# Patient Record
Sex: Male | Born: 1985 | Race: Black or African American | Hispanic: No | Marital: Single | Smoking: Former smoker
Health system: Southern US, Community
[De-identification: ages and names within clinical notes are randomized; demographics above are authoritative.]

## PROBLEM LIST (undated history)

## (undated) DIAGNOSIS — I82409 Acute embolism and thrombosis of unspecified deep veins of unspecified lower extremity: Secondary | ICD-10-CM

## (undated) HISTORY — PX: HAND SURGERY: SHX662

---

## 2013-06-25 ENCOUNTER — Encounter (HOSPITAL_COMMUNITY): Payer: Self-pay | Admitting: Emergency Medicine

## 2013-06-25 ENCOUNTER — Emergency Department (HOSPITAL_COMMUNITY)
Admission: EM | Admit: 2013-06-25 | Discharge: 2013-06-25 | Disposition: A | Discharge status: Home / Self Care | Payer: Self-pay | Attending: Emergency Medicine | Admitting: Emergency Medicine

## 2013-06-25 DIAGNOSIS — Z880 Allergy status to penicillin: Secondary | ICD-10-CM | POA: Insufficient documentation

## 2013-06-25 DIAGNOSIS — M7989 Other specified soft tissue disorders: Secondary | ICD-10-CM | POA: Insufficient documentation

## 2013-06-25 DIAGNOSIS — IMO0001 Reserved for inherently not codable concepts without codable children: Secondary | ICD-10-CM | POA: Insufficient documentation

## 2013-06-25 DIAGNOSIS — R799 Abnormal finding of blood chemistry, unspecified: Secondary | ICD-10-CM | POA: Insufficient documentation

## 2013-06-25 DIAGNOSIS — F17200 Nicotine dependence, unspecified, uncomplicated: Secondary | ICD-10-CM | POA: Insufficient documentation

## 2013-06-25 LAB — POCT I-STAT, CHEM 8
BUN: 14 mg/dL (ref 6–23)
Calcium, Ion: 1.26 mmol/L — ABNORMAL HIGH (ref 1.12–1.23)
Chloride: 100 mEq/L (ref 96–112)
Glucose, Bld: 80 mg/dL (ref 70–99)
HCT: 47 % (ref 39.0–52.0)
TCO2: 30 mmol/L (ref 0–100)

## 2013-06-25 LAB — PROTIME-INR: Prothrombin Time: 12.4 seconds (ref 11.6–15.2)

## 2013-06-25 MED ORDER — ENOXAPARIN SODIUM 100 MG/ML ~~LOC~~ SOLN
100.0000 mg | Freq: Once | SUBCUTANEOUS | Status: AC
  Administered 2013-06-25: 100 mg via SUBCUTANEOUS
  Filled 2013-06-25: qty 1

## 2013-06-25 NOTE — ED Provider Notes (Signed)
CSN: [id number]     Arrival date & time 06/25/13  1932 History   First MD Initiated Contact with Patient 06/25/13 2039     Chief Complaint  Patient presents with  . Leg Swelling   (Consider location/radiation/quality/duration/timing/severity/associated sxs/prior Treatment) Patient is a 27 y.o. male presenting with leg pain.  Leg Pain Location:  Leg Time since incident:  7 days Injury: no   Leg location:  L leg Pain details:    Quality:  Aching   Severity:  Severe (when ambulating, mild to none when resting)   Onset quality:  Sudden   Timing:  Intermittent   Progression:  Waxing and waning Chronicity:  New Prior injury to area:  No Relieved by:  Rest Associated symptoms: swelling     History reviewed. No pertinent past medical history. Past Surgical History  Procedure Laterality Date  . Hand surgery     Family History  Problem Relation Age of Onset  . Deep vein thrombosis Mother   . Cancer Other    History  Substance Use Topics  . Smoking status: Current Some Day Smoker  . Smokeless tobacco: Not on file  . Alcohol Use: Yes     Comment: occ    Review of Systems  Musculoskeletal: Positive for myalgias.  All other systems reviewed and are negative.    Allergies  Penicillins  Home Medications  No current outpatient prescriptions on file. BP 152/82  Pulse 54  Temp(Src) 97.6 F (36.4 C) (Oral)  Resp 14  Ht 6\' 2"  (1.88 m)  Wt 214 lb 4 oz (97.183 kg)  BMI 27.50 kg/m2  SpO2 100% Physical Exam  Nursing note and vitals reviewed. Constitutional: He is oriented to person, place, and time. He appears well-developed and well-nourished.  HENT:  Head: Normocephalic.  Eyes: Pupils are equal, round, and reactive to light.  Neck: Normal range of motion.  Cardiovascular: Normal rate and regular rhythm.   Pulmonary/Chest: Effort normal and breath sounds normal.  Abdominal: Soft.  Musculoskeletal: Normal range of motion. He exhibits edema and tenderness.       Left  lower leg: He exhibits tenderness and swelling.  Negative homan's.  Neurological: He is alert and oriented to person, place, and time.  Skin: Skin is warm and dry.  Psychiatric: He has a normal mood and affect. His behavior is normal. Judgment and thought content normal.    ED Course  Procedures (including critical care time) Labs Review Labs Reviewed  D-DIMER, QUANTITATIVE   Imaging Review No results found.  EKG Interpretation   None      Smoker.  No known injury.  No recent long-distance travel or prolonged sitting.  Labs reviewed, results shared with patient.  Elevated d-dimer. MDM  Calf pain with elevated d-dimer.  Will cover with dose of lovenox, patient to return to [facility] ED in the morning for lower extremity venous doppler.    Jimmye Norman, NP 06/26/13 0028

## 2013-06-25 NOTE — ED Notes (Signed)
Pt c/o LLE pain x 1 week. Pt does not recall what he was doing when pain began. Pt denies injury. Pt states 0/10 pain while lying, pain only when extremity dependent. Neg Homen's, no significant swelling noted, pain not reproducible. NP at bedside, plan of care explained to pt. Family at bedside. +CMS

## 2013-06-25 NOTE — ED Notes (Signed)
Pt states he has swelling in his left calf area  Pt states it started swelling on Wednesday  Pt denies injury  Pt states it is painful

## 2013-06-25 NOTE — ED Notes (Signed)
Pt given specific instructions to f/u with [facility] ED in AM for Doppler

## 2013-06-26 ENCOUNTER — Ambulatory Visit (HOSPITAL_COMMUNITY)
Admission: RE | Admit: 2013-06-26 | Discharge: 2013-06-26 | Disposition: A | Discharge status: Home / Self Care | Payer: Self-pay | Source: Ambulatory Visit | Attending: Emergency Medicine | Admitting: Emergency Medicine

## 2013-06-26 ENCOUNTER — Encounter (HOSPITAL_COMMUNITY): Payer: Self-pay | Admitting: Emergency Medicine

## 2013-06-26 ENCOUNTER — Emergency Department (HOSPITAL_COMMUNITY)
Admission: EM | Admit: 2013-06-26 | Discharge: 2013-06-26 | Disposition: A | Discharge status: Home / Self Care | Payer: Self-pay | Attending: Emergency Medicine | Admitting: Emergency Medicine

## 2013-06-26 DIAGNOSIS — M7989 Other specified soft tissue disorders: Secondary | ICD-10-CM

## 2013-06-26 DIAGNOSIS — M79609 Pain in unspecified limb: Secondary | ICD-10-CM

## 2013-06-26 DIAGNOSIS — F17200 Nicotine dependence, unspecified, uncomplicated: Secondary | ICD-10-CM | POA: Insufficient documentation

## 2013-06-26 DIAGNOSIS — I82402 Acute embolism and thrombosis of unspecified deep veins of left lower extremity: Secondary | ICD-10-CM

## 2013-06-26 DIAGNOSIS — I824Z9 Acute embolism and thrombosis of unspecified deep veins of unspecified distal lower extremity: Secondary | ICD-10-CM | POA: Insufficient documentation

## 2013-06-26 DIAGNOSIS — Z880 Allergy status to penicillin: Secondary | ICD-10-CM | POA: Insufficient documentation

## 2013-06-26 LAB — HOMOCYSTEINE: Homocysteine: 12.3 umol/L (ref 4.0–15.4)

## 2013-06-26 MED ORDER — RIVAROXABAN (XARELTO) EDUCATION KIT FOR DVT/PE PATIENTS
PACK | Freq: Once | Status: AC
  Administered 2013-06-26: 10:00:00
  Filled 2013-06-26: qty 1

## 2013-06-26 MED ORDER — RIVAROXABAN 15 MG PO TABS
15.0000 mg | ORAL_TABLET | Freq: Two times a day (BID) | ORAL | Status: DC
  Filled 2013-06-26 (×3): qty 1

## 2013-06-26 MED ORDER — RIVAROXABAN 15 MG PO TABS: 15.0000 mg | ORAL_TABLET | Freq: Two times a day (BID) | ORAL | Status: DC

## 2013-06-26 MED ORDER — RIVAROXABAN 15 MG PO TABS
15.0000 mg | ORAL_TABLET | Freq: Two times a day (BID) | ORAL | Status: DC
  Administered 2013-06-26: 15 mg via ORAL
  Filled 2013-06-26 (×2): qty 1

## 2013-06-26 MED ORDER — OXYCODONE-ACETAMINOPHEN 5-325 MG PO TABS: 1.0000 | ORAL_TABLET | ORAL | Status: DC | PRN

## 2013-06-26 NOTE — ED Provider Notes (Signed)
CSN: [id number]     Arrival date & time 06/26/13  0915 History   First MD Initiated Contact with Patient 06/26/13 [phone]     Chief Complaint  Patient presents with  . Positive DVT    (Consider location/radiation/quality/duration/timing/severity/associated sxs/prior Treatment) HPI Patient was seen in the ED last night and was sent for doppler US this morning to r/o DVT of the left leg.  The doppler was positive for DVT.  Patient denies CP, SOB, cough, fevers.   Denies any recent immobilization or personal history of blood clots.  States his mother has had blood clots in the past and is on Xarelto.  He does not know the cause of her blood clots.  Denies pain in the leg currently. Was given lovenox last night.  He does not have a PCP.   History reviewed. No pertinent past medical history. Past Surgical History  Procedure Laterality Date  . Hand surgery     Family History  Problem Relation Age of Onset  . Deep vein thrombosis Mother   . Cancer Other    History  Substance Use Topics  . Smoking status: Current Some Day Smoker  . Smokeless tobacco: Not on file  . Alcohol Use: Yes     Comment: occ    Review of Systems  Constitutional: Negative for fever.  Respiratory: Negative for cough and shortness of breath.   Cardiovascular: Positive for leg swelling. Negative for chest pain.  Gastrointestinal: Negative for nausea and vomiting.  Skin: Positive for color change.  Neurological: Negative for weakness and numbness.    Allergies  Penicillins  Home Medications  No current outpatient prescriptions on file. BP 124/75  Pulse 57  Temp(Src) 97.2 F (36.2 C) (Oral)  Resp 16  SpO2 100% Physical Exam  Nursing note and vitals reviewed. Constitutional: He appears well-developed and well-nourished. No distress.  HENT:  Head: Normocephalic and atraumatic.  Neck: Neck supple.  Pulmonary/Chest: Effort normal.  Musculoskeletal:       Left lower leg: He exhibits tenderness and swelling.   Left calf with some erythema, edema, tenderness.  Distal pulses intact, distal sensation intact.   Neurological: He is alert.  Skin: He is not diaphoretic.    ED Course  Procedures (including critical care time) Labs Review Labs Reviewed  ANTITHROMBIN III  PROTEIN C ACTIVITY  PROTEIN C, TOTAL  PROTEIN S ACTIVITY  PROTEIN S, TOTAL  LUPUS ANTICOAGULANT PANEL  BETA-2-GLYCOPROTEIN I ABS, IGG/M/A  HOMOCYSTEINE  FACTOR 5 LEIDEN  PROTHROMBIN GENE MUTATION  CARDIOLIPIN ANTIBODIES, IGG, IGM, IGA   Imaging Review No results found.  EKG Interpretation   None     \ 9:55 AM Pt currently pain free.  No CP or SOB.  No PCP.  Discussed pt with Dr Juleen China.  Have ordered hypercoaguability panel and will start on xarelto.  D/C home with PCP resources for follow up.    Filed Vitals:   06/26/13 1036  BP: 119/69  Pulse: 61  Temp:   Resp: 16     MDM   1. Left leg DVT     Pt p/w positive DVT study.  No known risk factors exact perhaps mother's hx of blood clots (unsure if there is a clotting disorder present).  Discussed treatment options with Dr Juleen China.  Pt given xarelto per pharmacy here and d/c home with same.  Referred to [facility].  Also will give other resources to ensure PCP follow up.  Pt is aware all clotting tests are pending and will  need to be followed up by PCP.  Pt verbalizes understanding that he will need to continue xarelto until indicated needs to stop by PCP (likely 3 months, depending on test results).  No CP or SOB concerning for PE.  Pt educated about PE/DVT and return precautions.  Discussed result, findings, treatment, and follow up  with patient.  Pt given return precautions.  Pt verbalizes understanding and agrees with plan.        Trixie Dredge, PA-C 06/26/13 1042

## 2013-06-26 NOTE — ED Notes (Signed)
Pt was seen at [facility] last for left leg pain for since at least Monday.  Had doppler done today and positive for blood clot

## 2013-06-26 NOTE — Progress Notes (Signed)
VASCULAR LAB PRELIMINARY  PRELIMINARY  PRELIMINARY  PRELIMINARY  Left lower extremity venous Dopplers completed.    Preliminary report:  There is acute DVT noted throughout the left posterior tibial vein.  All other veins appear thrombus free.  Kamilah Correia, RVT 06/26/2013, 9:09 AM

## 2013-06-27 NOTE — ED Provider Notes (Signed)
Medical screening examination/treatment/procedure(s) were performed by non-physician practitioner and as supervising physician I was immediately available for consultation/collaboration.  EKG Interpretation   None        Juliet Rude. Rubin Payor, MD 06/27/13 2355

## 2013-06-27 NOTE — ED Provider Notes (Signed)
Medical screening examination/treatment/procedure(s) were performed by non-physician practitioner and as supervising physician I was immediately available for consultation/collaboration.  EKG Interpretation   None        Juvenal Umar, MD 06/27/13 0732 

## 2013-06-28 LAB — LUPUS ANTICOAGULANT PANEL
DRVVT: 49.4 secs — ABNORMAL HIGH (ref <42.9)
PTT Lupus Anticoagulant: 42.5 secs (ref 28.0–43.0)

## 2013-06-28 LAB — PROTHROMBIN GENE MUTATION

## 2013-06-28 LAB — CARDIOLIPIN ANTIBODIES, IGG, IGM, IGA: Anticardiolipin IgG: 3 GPL U/mL — ABNORMAL LOW (ref <23)

## 2013-06-28 LAB — PROTEIN S ACTIVITY: Protein S Activity: 79 % (ref 69–129)

## 2013-06-28 LAB — BETA-2-GLYCOPROTEIN I ABS, IGG/M/A: Beta-2-Glycoprotein I IgA: 0 A Units (ref <20)

## 2013-06-28 LAB — PROTEIN S, TOTAL: Protein S Ag, Total: 88 % (ref 60–150)

## 2016-05-24 ENCOUNTER — Encounter (HOSPITAL_COMMUNITY): Payer: Self-pay | Admitting: *Deleted

## 2016-05-24 ENCOUNTER — Emergency Department (HOSPITAL_COMMUNITY)
Admission: EM | Admit: 2016-05-24 | Discharge: 2016-05-24 | Disposition: A | Discharge status: Home / Self Care | Payer: Self-pay | Attending: Emergency Medicine | Admitting: Emergency Medicine

## 2016-05-24 ENCOUNTER — Emergency Department (HOSPITAL_BASED_OUTPATIENT_CLINIC_OR_DEPARTMENT_OTHER): Admit: 2016-05-24 | Discharge: 2016-05-24 | Disposition: A | Discharge status: Home / Self Care | Payer: Self-pay

## 2016-05-24 DIAGNOSIS — M79609 Pain in unspecified limb: Secondary | ICD-10-CM

## 2016-05-24 DIAGNOSIS — F17210 Nicotine dependence, cigarettes, uncomplicated: Secondary | ICD-10-CM | POA: Insufficient documentation

## 2016-05-24 DIAGNOSIS — Z86718 Personal history of other venous thrombosis and embolism: Secondary | ICD-10-CM

## 2016-05-24 DIAGNOSIS — M79605 Pain in left leg: Secondary | ICD-10-CM

## 2016-05-24 HISTORY — DX: Acute embolism and thrombosis of unspecified deep veins of unspecified lower extremity: I82.409

## 2016-05-24 MED ORDER — NAPROXEN 500 MG PO TABS: 500.0000 mg | ORAL_TABLET | Freq: Two times a day (BID) | ORAL | 0 refills | Status: DC

## 2016-05-24 NOTE — ED Notes (Signed)
Patient transported to Ultrasound 

## 2016-05-24 NOTE — ED Notes (Signed)
Returned from U/S

## 2016-05-24 NOTE — ED Notes (Signed)
[facility] COUPON 173 GIVEN

## 2016-05-24 NOTE — ED Provider Notes (Signed)
[facility]-EMERGENCY DEPT Provider Note    By signing my name below, I, Earmon PhoenixJennifer Waddell, attest that this documentation has been prepared under the direction and in the presence of Vanice Rappa, PA-C. Electronically Signed: Earmon PhoenixJennifer Waddell, ED Scribe. 05/24/16. 11:41 AM.    History   Chief Complaint Chief Complaint  Patient presents with  . Leg Pain    The history is provided by the patient and medical records. No language interpreter was used.    HPI Comments:  Vincent Bray is a 30 y.o. male with PMHx of DVT of the left leg who presents to the Emergency Department complaining of posterior upper left leg pain that began about five months ago. He states the pain began in his left calf but has now moved into the posterior left thigh. He states he was on anticoagulation medication and discontinued it three years ago. Pt reports associated tingling of the left leg periodically that he believes is due to being on his feet for long periods of time. He has not taken anything for pain. Walking increases the pain. He denies alleviating factors. He denies CP, SOB, hemoptysis, redness, warmth or swelling of the LLE, back pain, numbness or weakness of the LLE, fever, chills, nausea, vomiting. He reports smoking. He states his mother has PMHx of blood clots.  Past Medical History:  Diagnosis Date  . DVT (deep venous thrombosis) (HCC)     There are no active problems to display for this patient.   Past Surgical History:  Procedure Laterality Date  . HAND SURGERY         Home Medications    Prior to Admission medications   Medication Sig Start Date End Date Taking? Authorizing Provider  oxyCODONE-acetaminophen (PERCOCET/ROXICET) 5-325 MG per tablet Take 1-2 tablets by mouth every 4 (four) hours as needed for severe pain. 06/26/13   Trixie DredgeEmily West, PA-C  Rivaroxaban (XARELTO) 15 MG TABS tablet Take 1 tablet (15 mg total) by mouth 2 (two) times daily. 06/26/13   Trixie DredgeEmily West, PA-C    Family  History Family History  Problem Relation Age of Onset  . Deep vein thrombosis Mother   . Cancer Other     Social History Social History  Substance Use Topics  . Smoking status: Current Some Day Smoker    Packs/day: 0.50    Types: Cigarettes  . Smokeless tobacco: Never Used  . Alcohol use Yes     Comment: occ     Allergies   Penicillins   Review of Systems Review of Systems  A complete 10 system review of systems was obtained and all systems are negative except as noted in the HPI and PMH.    Physical Exam Updated Vital Signs BP 110/71 (BP Location: Left Arm)   Pulse (!) 57   Temp 98.5 F (36.9 C) (Oral)   Resp 18   SpO2 100%   Physical Exam  Constitutional: He is oriented to person, place, and time. He appears well-developed and well-nourished.  HENT:  Head: Normocephalic and atraumatic.  Neck: Normal range of motion.  Cardiovascular: Normal rate.   Left DP pulse 2+  Pulmonary/Chest: Effort normal.  Musculoskeletal: Normal range of motion. He exhibits tenderness. He exhibits no edema or deformity.  No tenderness to palpation of left calf. Tenderness to palpation of left posterior upper leg in area of hamstring. No erythema, edema or warmth of left leg. Full ROM of the left leg.  No pain with ROM of left leg. No tenderness to palpation of thoracic  or lumbar spine.  Neurological: He is alert and oriented to person, place, and time.  Distal sensations of left foot intact. Strength 5/5 of BLE.  Skin: Skin is warm and dry. No erythema.  Psychiatric: He has a normal mood and affect. His behavior is normal.  Nursing note and vitals reviewed.    ED Treatments / Results  DIAGNOSTIC STUDIES: Oxygen Saturation is 100% on RA, normal by my interpretation.   COORDINATION OF CARE: 9:55 AM- Will order doppler study of LLE. Pt verbalizes understanding and agrees to plan.  Medications - No data to display    Labs (all labs ordered are listed, but only abnormal results  are displayed) Labs Reviewed - No data to display  EKG  EKG Interpretation None       Radiology No results found.  Procedures Procedures (including critical care time)  Medications Ordered in ED Medications - No data to display   Initial Impression / Assessment and Plan / ED Course  I have reviewed the triage vital signs and the nursing notes.  Pertinent labs & imaging results that were available during my care of the patient were reviewed by me and considered in my medical decision making (see chart for details).  Clinical Course   Patient with a history of LLE DVT presents today with a chief complaint of LLE pain.  No acute injury or trauma.  No signs of infection.  Doppler ultrasound negative for DVT.  Feel that symptoms are most consistent with muscle strain.  Stable for discharge.  Return precautions given.  I personally performed the services described in this documentation, which was scribed in my presence. The recorded information has been reviewed and is accurate.    Final Clinical Impressions(s) / ED Diagnoses   Final diagnoses:  None    New Prescriptions New Prescriptions   No medications on file     Santiago Glad, PA-C 05/24/16 1715    Donnetta Hutching, MD 05/26/16 [phone]

## 2016-05-24 NOTE — ED Triage Notes (Signed)
Pt states L leg pain for several months.  Hx of same several years ago and was dx and tx for a dvt.

## 2016-05-24 NOTE — Progress Notes (Signed)
*  PRELIMINARY RESULTS* Vascular Ultrasound Left lower extremity venous duplex has been completed.  Preliminary findings: No evidence of deep vein thrombosis or baker's cysts   Vincent Bray 05/24/2016, 11:08 AM

## 2016-05-24 NOTE — ED Notes (Signed)
C/o pain posterior left thigh x several months. Hx of LLE DVT. Was prescribed "blood thinner" but stopped taking because "I can't afford it'. States he smokes marijuana.

## 2018-06-05 ENCOUNTER — Emergency Department (HOSPITAL_COMMUNITY)
Admission: EM | Admit: 2018-06-05 | Discharge: 2018-06-05 | Disposition: A | Discharge status: Home / Self Care | Payer: Self-pay | Attending: Emergency Medicine | Admitting: Emergency Medicine

## 2018-06-05 ENCOUNTER — Encounter (HOSPITAL_COMMUNITY): Payer: Self-pay

## 2018-06-05 ENCOUNTER — Other Ambulatory Visit: Payer: Self-pay

## 2018-06-05 DIAGNOSIS — M79661 Pain in right lower leg: Secondary | ICD-10-CM | POA: Insufficient documentation

## 2018-06-05 DIAGNOSIS — Z86718 Personal history of other venous thrombosis and embolism: Secondary | ICD-10-CM | POA: Insufficient documentation

## 2018-06-05 DIAGNOSIS — Z79899 Other long term (current) drug therapy: Secondary | ICD-10-CM | POA: Insufficient documentation

## 2018-06-05 DIAGNOSIS — F17210 Nicotine dependence, cigarettes, uncomplicated: Secondary | ICD-10-CM | POA: Insufficient documentation

## 2018-06-05 NOTE — ED Notes (Signed)
E-signature not available, pt verbalized understanding of DC instructions and to return in AM for vascular study.

## 2018-06-05 NOTE — ED Triage Notes (Signed)
Pt from home for right leg pain, hx of DVT.

## 2018-06-05 NOTE — ED Provider Notes (Signed)
[facility] EMERGENCY DEPARTMENT Provider Note   CSN: [id number] Arrival date & time: 06/05/18  1857     History   Chief Complaint Chief Complaint  Patient presents with  . Leg Pain    HPI Vincent Bray is a 32 y.o. male presenting for evaluation of right leg pain.  Patient states pain began last night.  It is mild, and intermittent.  Pain is worse when he is standing on his feet for longer period of time.  Worse with use of the calf muscle.  He denies trauma or injury.  He denies recent travel, surgeries, immobilization, history of cancer.  Patient states he does have a history of a DVT 5 years ago, was on Xarelto for short period of time and then has not been on blood thinners since.  Family history of DVTs.  He denies chest pain or shortness of breath.  He denies fevers or chills.  He has not taken anything for pain including Tylenol or ibuprofen.  No pain on the left side.  Patient states he has no other medical problems, takes no medications daily.  He smokes cigarettes, denies alcohol or drug use.  Initial history obtained from chart review, patient with a positive DVT on ultrasound in 2014.  Has had multiple negative ultrasounds since.  HPI  Past Medical History:  Diagnosis Date  . DVT (deep venous thrombosis) (HCC)     There are no active problems to display for this patient.   Past Surgical History:  Procedure Laterality Date  . HAND SURGERY          Home Medications    Prior to Admission medications   Medication Sig Start Date End Date Taking? Authorizing Provider  naproxen (NAPROSYN) 500 MG tablet Take 1 tablet (500 mg total) by mouth 2 (two) times daily. 05/24/16   Santiago Glad, PA-C  oxyCODONE-acetaminophen (PERCOCET/ROXICET) 5-325 MG per tablet Take 1-2 tablets by mouth every 4 (four) hours as needed for severe pain. 06/26/13   Trixie Dredge, PA-C  Rivaroxaban (XARELTO) 15 MG TABS tablet Take 1 tablet (15 mg total) by mouth 2 (two) times  daily. 06/26/13   Trixie Dredge, PA-C    Family History Family History  Problem Relation Age of Onset  . Deep vein thrombosis Mother   . Cancer Other     Social History Social History   Tobacco Use  . Smoking status: Current Some Day Smoker    Packs/day: 0.50    Types: Cigarettes  . Smokeless tobacco: Never Used  Substance Use Topics  . Alcohol use: Yes    Comment: occ  . Drug use: Yes    Types: Marijuana     Allergies   Penicillins   Review of Systems Review of Systems  Musculoskeletal: Positive for myalgias.  All other systems reviewed and are negative.    Physical Exam Updated Vital Signs BP 127/82 (BP Location: Right Arm)   Pulse 92   Temp 97.9 F (36.6 C) (Oral)   Resp 16   SpO2 100%   Physical Exam  Constitutional: He is oriented to person, place, and time. He appears well-developed and well-nourished. No distress.  Sitting comfortably in the bed in no acute distress  HENT:  Head: Normocephalic and atraumatic.  Eyes: Pupils are equal, round, and reactive to light. Conjunctivae and EOM are normal.  Neck: Normal range of motion. Neck supple.  Cardiovascular: Normal rate, regular rhythm and intact distal pulses.  Pulmonary/Chest: Effort normal and breath sounds normal. No  respiratory distress. He has no wheezes.  Abdominal: Soft. He exhibits no distension. There is no tenderness.  Musculoskeletal: Normal range of motion. He exhibits no edema, tenderness or deformity.  No tenderness palpation of the left or right calf.  No obvious swelling or erythema.  Pedal pulses intact bilaterally.  Compartments soft.  Patient is amatory without difficulty.  Increased pain when standing and flexing the calf muscle.  No pain just with weightbearing.  Neurological: He is alert and oriented to person, place, and time. No sensory deficit.  Skin: Skin is warm and dry. Capillary refill takes less than 2 seconds.  Psychiatric: He has a normal mood and affect.  Nursing note  and vitals reviewed.   ED Treatments / Results  Labs (all labs ordered are listed, but only abnormal results are displayed) Labs Reviewed - No data to display  EKG None  Radiology No results found.  Procedures Procedures (including critical care time)  Medications Ordered in ED Medications - No data to display   Initial Impression / Assessment and Plan / ED Course  I have reviewed the triage vital signs and the nursing notes.  Pertinent labs & imaging results that were available during my care of the patient were reviewed by me and considered in my medical decision making (see chart for details).     Pt presenting for evaluation of right calf pain.  Physical exam reassuring, he is neurovascular intact.  Low suspicion for DVT, although patient has had a DVT in the past.  However, symptoms appear more musculoskeletal today.  Worse with flexion of the calf muscle.  No tenderness to palpation, no swelling, no erythema.  Doubt cellulitis.  As patient has a history of DVT, will order outpatient ultrasound for tomorrow.  I do not believe he needs Lovenox tonight.  Discussed treatment with Tylenol and ibuprofen.  At this time, patient proceed for discharge.  Return precautions given.  Patient states he understands and agrees to plan.   Final Clinical Impressions(s) / ED Diagnoses   Final diagnoses:  Right calf pain    ED Discharge Orders         Ordered    LE VENOUS     06/05/18 [address] PA-C 06/05/18 Margretta Ditty    Shaune Pollack, MD 06/05/18 2340

## 2018-06-05 NOTE — Discharge Instructions (Addendum)
Use Tylenol or ibuprofen as needed for pain. Follow-up for an outpatient ultrasound to rule out DVT. If you develop worsening pain, chest pain, difficulty breathing, return to the emergency room. Return to the emergency room with any new, worsening, concerning symptoms.

## 2018-06-06 ENCOUNTER — Ambulatory Visit (HOSPITAL_COMMUNITY): Admission: RE | Admit: 2018-06-06 | Payer: Self-pay | Source: Ambulatory Visit

## 2020-03-04 ENCOUNTER — Ambulatory Visit (HOSPITAL_COMMUNITY)
Admission: EM | Admit: 2020-03-04 | Discharge: 2020-03-05 | Disposition: A | Discharge status: Home / Self Care | Attending: Emergency Medicine | Admitting: Emergency Medicine

## 2020-03-04 ENCOUNTER — Other Ambulatory Visit: Payer: Self-pay

## 2020-03-04 DIAGNOSIS — Z86718 Personal history of other venous thrombosis and embolism: Secondary | ICD-10-CM | POA: Diagnosis not present

## 2020-03-04 DIAGNOSIS — Z7901 Long term (current) use of anticoagulants: Secondary | ICD-10-CM | POA: Diagnosis not present

## 2020-03-04 DIAGNOSIS — S02652A Fracture of angle of left mandible, initial encounter for closed fracture: Secondary | ICD-10-CM | POA: Insufficient documentation

## 2020-03-04 DIAGNOSIS — Z791 Long term (current) use of non-steroidal anti-inflammatories (NSAID): Secondary | ICD-10-CM | POA: Diagnosis not present

## 2020-03-04 DIAGNOSIS — S0083XA Contusion of other part of head, initial encounter: Secondary | ICD-10-CM | POA: Diagnosis not present

## 2020-03-04 DIAGNOSIS — S0269XA Fracture of mandible of other specified site, initial encounter for closed fracture: Secondary | ICD-10-CM

## 2020-03-04 DIAGNOSIS — S02609B Fracture of mandible, unspecified, initial encounter for open fracture: Secondary | ICD-10-CM

## 2020-03-04 DIAGNOSIS — F17210 Nicotine dependence, cigarettes, uncomplicated: Secondary | ICD-10-CM | POA: Insufficient documentation

## 2020-03-04 DIAGNOSIS — Z20822 Contact with and (suspected) exposure to covid-19: Secondary | ICD-10-CM | POA: Diagnosis not present

## 2020-03-04 DIAGNOSIS — S0269XB Fracture of mandible of other specified site, initial encounter for open fracture: Secondary | ICD-10-CM

## 2020-03-04 DIAGNOSIS — S01511A Laceration without foreign body of lip, initial encounter: Secondary | ICD-10-CM

## 2020-03-04 DIAGNOSIS — S02671B Fracture of alveolus of right mandible, initial encounter for open fracture: Secondary | ICD-10-CM | POA: Diagnosis present
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
## 2020-03-05 ENCOUNTER — Encounter (HOSPITAL_COMMUNITY): Payer: Self-pay | Admitting: Certified Registered Nurse Anesthetist

## 2020-03-05 ENCOUNTER — Emergency Department (HOSPITAL_COMMUNITY): Admitting: Anesthesiology

## 2020-03-05 ENCOUNTER — Emergency Department (HOSPITAL_COMMUNITY)

## 2020-03-05 ENCOUNTER — Encounter (HOSPITAL_COMMUNITY)
Admission: EM | Disposition: A | Discharge status: Home / Self Care | Payer: Self-pay | Source: Home / Self Care | Attending: Emergency Medicine

## 2020-03-05 DIAGNOSIS — Z7901 Long term (current) use of anticoagulants: Secondary | ICD-10-CM | POA: Diagnosis not present

## 2020-03-05 DIAGNOSIS — S01511A Laceration without foreign body of lip, initial encounter: Secondary | ICD-10-CM

## 2020-03-05 DIAGNOSIS — S02652A Fracture of angle of left mandible, initial encounter for closed fracture: Secondary | ICD-10-CM | POA: Diagnosis not present

## 2020-03-05 DIAGNOSIS — Z791 Long term (current) use of non-steroidal anti-inflammatories (NSAID): Secondary | ICD-10-CM | POA: Diagnosis not present

## 2020-03-05 DIAGNOSIS — Z20822 Contact with and (suspected) exposure to covid-19: Secondary | ICD-10-CM | POA: Diagnosis not present

## 2020-03-05 DIAGNOSIS — S02671B Fracture of alveolus of right mandible, initial encounter for open fracture: Secondary | ICD-10-CM | POA: Diagnosis present

## 2020-03-05 DIAGNOSIS — S02609B Fracture of mandible, unspecified, initial encounter for open fracture: Secondary | ICD-10-CM

## 2020-03-05 DIAGNOSIS — Z86718 Personal history of other venous thrombosis and embolism: Secondary | ICD-10-CM | POA: Diagnosis not present

## 2020-03-05 DIAGNOSIS — S0083XA Contusion of other part of head, initial encounter: Secondary | ICD-10-CM | POA: Diagnosis not present

## 2020-03-05 DIAGNOSIS — F17210 Nicotine dependence, cigarettes, uncomplicated: Secondary | ICD-10-CM | POA: Diagnosis not present

## 2020-03-05 HISTORY — PX: ORIF MANDIBULAR FRACTURE: SHX2127

## 2020-03-05 LAB — CBC WITH DIFFERENTIAL/PLATELET
Abs Immature Granulocytes: 0.05 10*3/uL (ref 0.00–0.07)
Basophils Absolute: 0 10*3/uL (ref 0.0–0.1)
Basophils Relative: 0 %
Eosinophils Absolute: 0 10*3/uL (ref 0.0–0.5)
Eosinophils Relative: 0 %
HCT: 45.1 % (ref 39.0–52.0)
Hemoglobin: 15 g/dL (ref 13.0–17.0)
Immature Granulocytes: 0 %
Lymphocytes Relative: 11 %
Lymphs Abs: 1.3 10*3/uL (ref 0.7–4.0)
MCH: 31.6 pg (ref 26.0–34.0)
MCHC: 33.3 g/dL (ref 30.0–36.0)
MCV: 95.1 fL (ref 80.0–100.0)
Monocytes Absolute: 0.9 10*3/uL (ref 0.1–1.0)
Monocytes Relative: 7 %
Neutro Abs: 9.6 10*3/uL — ABNORMAL HIGH (ref 1.7–7.7)
Neutrophils Relative %: 82 %
Platelets: 198 10*3/uL (ref 150–400)
RBC: 4.74 MIL/uL (ref 4.22–5.81)
RDW: 12.5 % (ref 11.5–15.5)
WBC: 11.8 10*3/uL — ABNORMAL HIGH (ref 4.0–10.5)
nRBC: 0 % (ref 0.0–0.2)

## 2020-03-05 LAB — COMPREHENSIVE METABOLIC PANEL
ALT: 19 U/L (ref 0–44)
AST: 26 U/L (ref 15–41)
Albumin: 5.4 g/dL — ABNORMAL HIGH (ref 3.5–5.0)
Alkaline Phosphatase: 48 U/L (ref 38–126)
Anion gap: 10 (ref 5–15)
BUN: 11 mg/dL (ref 6–20)
CO2: 25 mmol/L (ref 22–32)
Calcium: 9.7 mg/dL (ref 8.9–10.3)
Chloride: 102 mmol/L (ref 98–111)
Creatinine, Ser: 1.37 mg/dL — ABNORMAL HIGH (ref 0.61–1.24)
GFR calc Af Amer: >60 mL/min (ref >60)
GFR calc non Af Amer: >60 mL/min (ref >60)
Glucose, Bld: 90 mg/dL (ref 70–99)
Potassium: 3.8 mmol/L (ref 3.5–5.1)
Sodium: 137 mmol/L (ref 135–145)
Total Bilirubin: 0.5 mg/dL (ref 0.3–1.2)
Total Protein: 7.9 g/dL (ref 6.5–8.1)

## 2020-03-05 LAB — SARS CORONAVIRUS 2 BY RT PCR (HOSPITAL ORDER, PERFORMED IN ~~LOC~~ HOSPITAL LAB): SARS Coronavirus 2: NEGATIVE

## 2020-03-05 SURGERY — OPEN REDUCTION INTERNAL FIXATION (ORIF) MANDIBULAR FRACTURE
Anesthesia: General | Site: Mouth

## 2020-03-05 MED ORDER — ACETAMINOPHEN 160 MG/5ML PO SOLN: 1000.0000 mg | Freq: Once | ORAL | Status: DC | PRN

## 2020-03-05 MED ORDER — ACETAMINOPHEN 500 MG PO TABS: 1000.0000 mg | ORAL_TABLET | Freq: Once | ORAL | Status: DC | PRN

## 2020-03-05 MED ORDER — SODIUM CHLORIDE 0.9 % IV BOLUS
500.0000 mL | Freq: Once | INTRAVENOUS | Status: AC
  Administered 2020-03-05: 500 mL via INTRAVENOUS

## 2020-03-05 MED ORDER — ROCURONIUM BROMIDE 10 MG/ML (PF) SYRINGE
PREFILLED_SYRINGE | INTRAVENOUS | Status: DC | PRN
  Administered 2020-03-05: 80 mg via INTRAVENOUS

## 2020-03-05 MED ORDER — CEPHALEXIN 250 MG/5ML PO SUSR
500.0000 mg | Freq: Three times a day (TID) | ORAL | 0 refills | Status: DC
Start: 2020-03-05 — End: 2020-05-22

## 2020-03-05 MED ORDER — MIDAZOLAM HCL 2 MG/2ML IJ SOLN
INTRAMUSCULAR | Status: DC | PRN
  Administered 2020-03-05: 2 mg via INTRAVENOUS

## 2020-03-05 MED ORDER — ACETAMINOPHEN 10 MG/ML IV SOLN: 1000.0000 mg | Freq: Once | INTRAVENOUS | Status: DC | PRN

## 2020-03-05 MED ORDER — 0.9 % SODIUM CHLORIDE (POUR BTL) OPTIME
TOPICAL | Status: DC | PRN
  Administered 2020-03-05: 1000 mL

## 2020-03-05 MED ORDER — FENTANYL CITRATE (PF) 250 MCG/5ML IJ SOLN
INTRAMUSCULAR | Status: AC
  Filled 2020-03-05: qty 5

## 2020-03-05 MED ORDER — LACTATED RINGERS IV SOLN: INTRAVENOUS | Status: DC | PRN

## 2020-03-05 MED ORDER — BACITRACIN ZINC 500 UNIT/GM EX OINT
TOPICAL_OINTMENT | CUTANEOUS | Status: AC
  Filled 2020-03-05: qty 28.35

## 2020-03-05 MED ORDER — CLINDAMYCIN PHOSPHATE 600 MG/50ML IV SOLN
600.0000 mg | Freq: Once | INTRAVENOUS | Status: AC
  Administered 2020-03-05: 600 mg via INTRAVENOUS
  Filled 2020-03-05: qty 50

## 2020-03-05 MED ORDER — DEXAMETHASONE SODIUM PHOSPHATE 10 MG/ML IJ SOLN
INTRAMUSCULAR | Status: DC | PRN
  Administered 2020-03-05: 10 mg via INTRAVENOUS

## 2020-03-05 MED ORDER — PROPOFOL 10 MG/ML IV BOLUS
INTRAVENOUS | Status: DC | PRN
  Administered 2020-03-05: 130 mg via INTRAVENOUS

## 2020-03-05 MED ORDER — SUGAMMADEX SODIUM 200 MG/2ML IV SOLN
INTRAVENOUS | Status: DC | PRN
  Administered 2020-03-05: 400 mg via INTRAVENOUS

## 2020-03-05 MED ORDER — MORPHINE SULFATE (PF) 4 MG/ML IV SOLN: 4.0000 mg | INTRAVENOUS | Status: DC | PRN

## 2020-03-05 MED ORDER — FENTANYL CITRATE (PF) 100 MCG/2ML IJ SOLN: 25.0000 ug | INTRAMUSCULAR | Status: DC | PRN

## 2020-03-05 MED ORDER — OXYCODONE HCL 5 MG/5ML PO SOLN: 5.0000 mg | Freq: Once | ORAL | Status: DC | PRN

## 2020-03-05 MED ORDER — ACETAMINOPHEN 10 MG/ML IV SOLN
INTRAVENOUS | Status: DC | PRN
  Administered 2020-03-05: 1000 mg via INTRAVENOUS

## 2020-03-05 MED ORDER — ONDANSETRON HCL 4 MG/2ML IJ SOLN
INTRAMUSCULAR | Status: DC | PRN
  Administered 2020-03-05: 4 mg via INTRAVENOUS

## 2020-03-05 MED ORDER — FENTANYL CITRATE (PF) 250 MCG/5ML IJ SOLN
INTRAMUSCULAR | Status: DC | PRN
  Administered 2020-03-05: 100 ug via INTRAVENOUS
  Administered 2020-03-05 (×2): 50 ug via INTRAVENOUS

## 2020-03-05 MED ORDER — OXYCODONE HCL 5 MG PO TABS: 5.0000 mg | ORAL_TABLET | Freq: Once | ORAL | Status: DC | PRN

## 2020-03-05 MED ORDER — OXYMETAZOLINE HCL 0.05 % NA SOLN
NASAL | Status: AC
  Filled 2020-03-05: qty 30

## 2020-03-05 MED ORDER — LIDOCAINE 2% (20 MG/ML) 5 ML SYRINGE
INTRAMUSCULAR | Status: DC | PRN
  Administered 2020-03-05: 60 mg via INTRAVENOUS

## 2020-03-05 MED ORDER — BACITRACIN ZINC 500 UNIT/GM EX OINT
TOPICAL_OINTMENT | CUTANEOUS | Status: DC | PRN
  Administered 2020-03-05: 1 via TOPICAL

## 2020-03-05 MED ORDER — KETAMINE HCL 10 MG/ML IJ SOLN
INTRAMUSCULAR | Status: DC | PRN
Start: 2020-03-05 — End: 2020-03-05
  Administered 2020-03-05: 30 mg via INTRAVENOUS

## 2020-03-05 MED ORDER — PROPOFOL 10 MG/ML IV BOLUS
INTRAVENOUS | Status: AC
  Filled 2020-03-05: qty 20

## 2020-03-05 MED ORDER — CLINDAMYCIN PHOSPHATE 600 MG/50ML IV SOLN
INTRAVENOUS | Status: DC | PRN
Start: 2020-03-05 — End: 2020-03-05
  Administered 2020-03-05: 600 mg via INTRAVENOUS

## 2020-03-05 MED ORDER — DEXMEDETOMIDINE HCL 200 MCG/2ML IV SOLN
INTRAVENOUS | Status: DC | PRN
  Administered 2020-03-05 (×5): 4 ug via INTRAVENOUS

## 2020-03-05 MED ORDER — IBUPROFEN 200 MG PO CAPS: 600.0000 mg | ORAL_CAPSULE | Freq: Three times a day (TID) | ORAL | 1 refills | Status: AC

## 2020-03-05 MED ORDER — CLINDAMYCIN PHOSPHATE 600 MG/50ML IV SOLN
INTRAVENOUS | Status: AC
  Filled 2020-03-05: qty 50

## 2020-03-05 MED ORDER — MORPHINE SULFATE (PF) 4 MG/ML IV SOLN
4.0000 mg | Freq: Once | INTRAVENOUS | Status: AC
  Administered 2020-03-05: 4 mg via INTRAVENOUS
  Filled 2020-03-05: qty 1

## 2020-03-05 MED ORDER — MIDAZOLAM HCL 2 MG/2ML IJ SOLN
INTRAMUSCULAR | Status: AC
  Filled 2020-03-05: qty 2

## 2020-03-05 MED ORDER — PHENYLEPHRINE HCL (PRESSORS) 10 MG/ML IV SOLN
INTRAVENOUS | Status: DC | PRN
  Administered 2020-03-05 (×2): 40 ug via INTRAVENOUS
  Administered 2020-03-05: 80 ug via INTRAVENOUS

## 2020-03-05 MED ORDER — ACETAMINOPHEN 10 MG/ML IV SOLN
INTRAVENOUS | Status: AC
  Filled 2020-03-05: qty 100

## 2020-03-05 MED ORDER — ONDANSETRON HCL 4 MG/2ML IJ SOLN
4.0000 mg | Freq: Once | INTRAMUSCULAR | Status: AC | PRN
  Administered 2020-03-05: 4 mg via INTRAVENOUS
  Filled 2020-03-05: qty 2

## 2020-03-05 SURGICAL SUPPLY — 35 items
BAR FIX PREFORMED OMNIMAX (Miscellaneous) ×4 IMPLANT
BLADE SURG 15 STRL LF DISP TIS (BLADE) ×1 IMPLANT
BLADE SURG 15 STRL SS (BLADE) ×1
CANISTER SUCT 3000ML PPV (MISCELLANEOUS) ×2 IMPLANT
CLEANER TIP ELECTROSURG 2X2 (MISCELLANEOUS) ×2 IMPLANT
DRAPE HALF SHEET 40X57 (DRAPES) IMPLANT
ELECT COATED BLADE 2.86 ST (ELECTRODE) ×2 IMPLANT
ELECT REM PT RETURN 9FT ADLT (ELECTROSURGICAL) ×2
ELECTRODE REM PT RTRN 9FT ADLT (ELECTROSURGICAL) ×1 IMPLANT
GLOVE BIOGEL M 7.0 STRL (GLOVE) ×4 IMPLANT
GOWN STRL REUS W/ TWL LRG LVL3 (GOWN DISPOSABLE) ×2 IMPLANT
GOWN STRL REUS W/TWL LRG LVL3 (GOWN DISPOSABLE) ×2
KIT BASIN OR (CUSTOM PROCEDURE TRAY) ×2 IMPLANT
KIT TURNOVER KIT B (KITS) ×2 IMPLANT
NEEDLE HYPO 25GX1X1/2 BEV (NEEDLE) IMPLANT
NS IRRIG 1000ML POUR BTL (IV SOLUTION) ×2 IMPLANT
PAD ARMBOARD 7.5X6 YLW CONV (MISCELLANEOUS) ×4 IMPLANT
PENCIL SMOKE EVACUATOR (MISCELLANEOUS) IMPLANT
SCISSORS WIRE ANG 4 3/4 DISP (INSTRUMENTS) ×2 IMPLANT
SCREW BONE 2X7 CROSS DRIVE (Screw) ×8 IMPLANT
SCREW BONE MANDIB SD 2X9 (Screw) ×16 IMPLANT
SCREW SD IMF HEX 2.0X9 (Screw) ×2 IMPLANT
STAPLER VISISTAT 35W (STAPLE) ×2 IMPLANT
SUT CHROMIC 3 0 SH 27 (SUTURE) IMPLANT
SUT CHROMIC 4 0 P 3 18 (SUTURE) ×2 IMPLANT
SUT ETHILON 3 0 PS 1 (SUTURE) IMPLANT
SUT SILK 3 0 (SUTURE)
SUT SILK 3 0 SH 30 (SUTURE) IMPLANT
SUT SILK 3-0 18XBRD TIE 12 (SUTURE) IMPLANT
SUT STEEL 2 (SUTURE) ×2 IMPLANT
SUT VIC AB 3-0 FS2 27 (SUTURE) IMPLANT
TOWEL GREEN STERILE FF (TOWEL DISPOSABLE) ×2 IMPLANT
TRAY ENT MC OR (CUSTOM PROCEDURE TRAY) ×2 IMPLANT
WATER STERILE IRR 1000ML POUR (IV SOLUTION) ×2 IMPLANT
YANKAUER SUCT BULB TIP NO VENT (SUCTIONS) ×2 IMPLANT

## 2020-03-05 NOTE — ED Notes (Signed)
Consulting civil engineer at [employer] made aware of patient transport after shift change.
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
## 2020-03-05 NOTE — Transfer of Care (Signed)
Immediate Anesthesia Transfer of Care Note  Patient: Vincent Bray  Procedure(s) Performed: OPEN REDUCTION INTERNAL FIXATION (ORIF) MANDIBULAR FRACTURE (N/A Mouth)  Patient Location: PACU  Anesthesia Type:General  Level of Consciousness: sedated  Airway & Oxygen Therapy: Patient Spontanous Breathing and Patient connected to face mask oxygen  Post-op Assessment: Report given to RN and Post -op Vital signs reviewed and stable  Post vital signs: Reviewed and stable  Last Vitals:  Vitals Value Taken Time  BP 124/72 03/05/20 1055  Temp    Pulse 61 03/05/20 1102  Resp 9 03/05/20 1102  SpO2 100 % 03/05/20 1102  Vitals shown include unvalidated device data.  Last Pain:  Vitals:   03/05/20 1055  TempSrc:   PainSc: (P) Asleep      Patients Stated Pain Goal: 0 (03/05/20 5003)  Complications: No complications documented.

## 2020-03-05 NOTE — Anesthesia Preprocedure Evaluation (Signed)
Anesthesia Evaluation  Patient identified by MRN, date of birth, ID band Patient awake    Reviewed: Allergy & Precautions, NPO status , Patient's Chart, lab work & pertinent test results  History of Anesthesia Complications Negative for: history of anesthetic complications  Airway Mallampati: IV  TM Distance: >3 FB Neck ROM: Full  Mouth opening: Limited Mouth Opening  Dental  (+) Dental Advisory Given   Pulmonary neg recent URI, Current Smoker and Patient abstained from smoking.,    breath sounds clear to auscultation       Cardiovascular negative cardio ROS   Rhythm:Regular     Neuro/Psych negative neurological ROS  negative psych ROS   GI/Hepatic negative GI ROS, Neg liver ROS,   Endo/Other  negative endocrine ROS  Renal/GU negative Renal ROS     Musculoskeletal negative musculoskeletal ROS (+)   Abdominal   Peds  Hematology negative hematology ROS (+)   Anesthesia Other Findings   Reproductive/Obstetrics                             Anesthesia Physical Anesthesia Plan  ASA: II  Anesthesia Plan: General   Post-op Pain Management:    Induction: Intravenous  PONV Risk Score and Plan: 1 and Ondansetron and Dexamethasone  Airway Management Planned: Nasal ETT  Additional Equipment: None  Intra-op Plan:   Post-operative Plan: Extubation in OR  Informed Consent: I have reviewed the patients History and Physical, chart, labs and discussed the procedure including the risks, benefits and alternatives for the proposed anesthesia with the patient or authorized representative who has indicated his/her understanding and acceptance.     Dental advisory given  Plan Discussed with: CRNA and Surgeon  Anesthesia Plan Comments:         Anesthesia Quick Evaluation
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
## 2020-03-05 NOTE — ED Notes (Signed)
[facility] packet prepped and placed at nurses station.

## 2020-03-05 NOTE — Anesthesia Procedure Notes (Signed)
Procedure Name: Intubation Date/Time: 03/05/2020 9:28 AM Performed by: Dairl Ponder, CRNA Pre-anesthesia Checklist: Patient identified, Emergency Drugs available, Suction available, Patient being monitored and Timeout performed Patient Re-evaluated:Patient Re-evaluated prior to induction Oxygen Delivery Method: Circle system utilized Preoxygenation: Pre-oxygenation with 100% oxygen Induction Type: IV induction Ventilation: Mask ventilation without difficulty Laryngoscope Size: Glidescope and 4 Grade View: Grade I Nasal Tubes: Right and Nasal prep performed Tube size: 7.5 mm Number of attempts: 1 Airway Equipment and Method: Stylet Placement Confirmation: ETT inserted through vocal cords under direct vision,  positive ETCO2 and breath sounds checked- equal and bilateral Tube secured with: Tape Dental Injury: Teeth and Oropharynx as per pre-operative assessment

## 2020-03-05 NOTE — ED Notes (Addendum)
[facility] called to pick patient up at 8a for surgery at [facility] at 800.

## 2020-03-05 NOTE — ED Notes (Signed)
Report provided to [facility]. Pt transported to [facility] at this time.
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
## 2020-03-06 ENCOUNTER — Encounter (HOSPITAL_COMMUNITY): Payer: Self-pay | Admitting: Otolaryngology

## 2020-03-09 ENCOUNTER — Encounter (HOSPITAL_COMMUNITY): Payer: Self-pay | Admitting: Otolaryngology

## 2020-03-11 NOTE — Anesthesia Postprocedure Evaluation (Signed)
Anesthesia Post Note  Patient: Vincent Bray  Procedure(s) Performed: OPEN REDUCTION INTERNAL FIXATION (ORIF) MANDIBULAR FRACTURE (N/A Mouth)     Patient location during evaluation: PACU Anesthesia Type: General Level of consciousness: awake and alert Pain management: pain level controlled Vital Signs Assessment: post-procedure vital signs reviewed and stable Respiratory status: spontaneous breathing, nonlabored ventilation, respiratory function stable and patient connected to nasal cannula oxygen Cardiovascular status: blood pressure returned to baseline and stable Postop Assessment: no apparent nausea or vomiting Anesthetic complications: no   No complications documented.  Last Vitals:  Vitals:   03/05/20 1125 03/05/20 1130  BP: (!) 138/96 (!) 137/92  Pulse: 65 64  Resp: 11 13  Temp: 36.9 C   SpO2: 100% 100%    Last Pain:  Vitals:   03/05/20 1125  TempSrc:   PainSc: 0-No pain                 Jeanice Dempsey

## 2020-03-22 ENCOUNTER — Encounter (HOSPITAL_COMMUNITY): Payer: Self-pay | Admitting: Otolaryngology

## 2020-05-05 ENCOUNTER — Other Ambulatory Visit: Payer: Self-pay | Admitting: Otolaryngology

## 2020-05-05 ENCOUNTER — Other Ambulatory Visit (HOSPITAL_COMMUNITY): Payer: Self-pay | Admitting: Otolaryngology

## 2020-05-05 DIAGNOSIS — S02600D Fracture of unspecified part of body of mandible, subsequent encounter for fracture with routine healing: Secondary | ICD-10-CM

## 2020-05-10 ENCOUNTER — Other Ambulatory Visit: Payer: Self-pay | Admitting: Otolaryngology

## 2020-05-12 ENCOUNTER — Ambulatory Visit (HOSPITAL_COMMUNITY)
Admission: RE | Admit: 2020-05-12 | Discharge: 2020-05-12 | Disposition: A | Discharge status: Home / Self Care | Payer: Self-pay | Source: Ambulatory Visit | Attending: Otolaryngology | Admitting: Otolaryngology

## 2020-05-12 DIAGNOSIS — S02600D Fracture of unspecified part of body of mandible, subsequent encounter for fracture with routine healing: Secondary | ICD-10-CM | POA: Insufficient documentation

## 2020-05-22 ENCOUNTER — Encounter (HOSPITAL_BASED_OUTPATIENT_CLINIC_OR_DEPARTMENT_OTHER): Payer: Self-pay | Admitting: Otolaryngology

## 2020-05-22 ENCOUNTER — Other Ambulatory Visit: Payer: Self-pay

## 2020-05-22 ENCOUNTER — Other Ambulatory Visit (HOSPITAL_COMMUNITY): Payer: Self-pay

## 2020-05-25 ENCOUNTER — Encounter (HOSPITAL_BASED_OUTPATIENT_CLINIC_OR_DEPARTMENT_OTHER)
Admission: RE | Disposition: A | Discharge status: Home / Self Care | Payer: Self-pay | Source: Home / Self Care | Attending: Otolaryngology

## 2020-05-25 ENCOUNTER — Ambulatory Visit (HOSPITAL_BASED_OUTPATIENT_CLINIC_OR_DEPARTMENT_OTHER)
Admission: RE | Admit: 2020-05-25 | Discharge: 2020-05-25 | Disposition: A | Discharge status: Home / Self Care | Attending: Otolaryngology | Admitting: Otolaryngology

## 2020-05-25 ENCOUNTER — Ambulatory Visit (HOSPITAL_BASED_OUTPATIENT_CLINIC_OR_DEPARTMENT_OTHER): Admitting: Certified Registered"

## 2020-05-25 ENCOUNTER — Encounter (HOSPITAL_BASED_OUTPATIENT_CLINIC_OR_DEPARTMENT_OTHER): Payer: Self-pay | Admitting: Otolaryngology

## 2020-05-25 ENCOUNTER — Encounter (HOSPITAL_BASED_OUTPATIENT_CLINIC_OR_DEPARTMENT_OTHER)
Admission: RE | Admit: 2020-05-25 | Discharge: 2020-05-25 | Disposition: A | Discharge status: Home / Self Care | Source: Ambulatory Visit | Attending: Otolaryngology | Admitting: Otolaryngology

## 2020-05-25 ENCOUNTER — Other Ambulatory Visit: Payer: Self-pay

## 2020-05-25 DIAGNOSIS — Z880 Allergy status to penicillin: Secondary | ICD-10-CM | POA: Insufficient documentation

## 2020-05-25 DIAGNOSIS — Z472 Encounter for removal of internal fixation device: Secondary | ICD-10-CM | POA: Insufficient documentation

## 2020-05-25 DIAGNOSIS — Z20822 Contact with and (suspected) exposure to covid-19: Secondary | ICD-10-CM | POA: Diagnosis not present

## 2020-05-25 DIAGNOSIS — Z87891 Personal history of nicotine dependence: Secondary | ICD-10-CM | POA: Diagnosis not present

## 2020-05-25 HISTORY — PX: MANDIBULAR HARDWARE REMOVAL: SHX5205

## 2020-05-25 LAB — SARS CORONAVIRUS 2 BY RT PCR (HOSPITAL ORDER, PERFORMED IN ~~LOC~~ HOSPITAL LAB): SARS Coronavirus 2: NEGATIVE

## 2020-05-25 SURGERY — REMOVAL, HARDWARE, MANDIBLE
Anesthesia: Monitor Anesthesia Care | Site: Mouth

## 2020-05-25 MED ORDER — MIDAZOLAM HCL 2 MG/2ML IJ SOLN
INTRAMUSCULAR | Status: AC
  Filled 2020-05-25: qty 2

## 2020-05-25 MED ORDER — PROMETHAZINE HCL 25 MG/ML IJ SOLN: 6.2500 mg | INTRAMUSCULAR | Status: DC | PRN

## 2020-05-25 MED ORDER — FENTANYL CITRATE (PF) 100 MCG/2ML IJ SOLN
INTRAMUSCULAR | Status: DC | PRN
Start: 2020-05-25 — End: 2020-05-25
  Administered 2020-05-25 (×2): 50 ug via INTRAVENOUS

## 2020-05-25 MED ORDER — LACTATED RINGERS IV SOLN: INTRAVENOUS | Status: DC

## 2020-05-25 MED ORDER — OXYCODONE HCL 5 MG PO TABS
5.0000 mg | ORAL_TABLET | Freq: Once | ORAL | Status: AC | PRN
  Administered 2020-05-25: 5 mg via ORAL

## 2020-05-25 MED ORDER — FENTANYL CITRATE (PF) 100 MCG/2ML IJ SOLN
25.0000 ug | INTRAMUSCULAR | Status: DC | PRN
  Administered 2020-05-25: 25 ug via INTRAVENOUS

## 2020-05-25 MED ORDER — PROPOFOL 10 MG/ML IV BOLUS
INTRAVENOUS | Status: AC
  Filled 2020-05-25: qty 20

## 2020-05-25 MED ORDER — FENTANYL CITRATE (PF) 100 MCG/2ML IJ SOLN
INTRAMUSCULAR | Status: AC
  Filled 2020-05-25: qty 2

## 2020-05-25 MED ORDER — GLYCOPYRROLATE PF 0.2 MG/ML IJ SOSY
PREFILLED_SYRINGE | INTRAMUSCULAR | Status: AC
  Filled 2020-05-25: qty 1

## 2020-05-25 MED ORDER — DEXMEDETOMIDINE (PRECEDEX) IN NS 20 MCG/5ML (4 MCG/ML) IV SYRINGE
PREFILLED_SYRINGE | INTRAVENOUS | Status: DC | PRN
  Administered 2020-05-25 (×2): 8 ug via INTRAVENOUS
  Administered 2020-05-25: 4 ug via INTRAVENOUS

## 2020-05-25 MED ORDER — PROPOFOL 500 MG/50ML IV EMUL
INTRAVENOUS | Status: DC | PRN
  Administered 2020-05-25: 100 ug/kg/min via INTRAVENOUS

## 2020-05-25 MED ORDER — OXYCODONE HCL 5 MG/5ML PO SOLN: 5.0000 mg | Freq: Once | ORAL | Status: AC | PRN

## 2020-05-25 MED ORDER — OXYCODONE HCL 5 MG PO TABS
ORAL_TABLET | ORAL | Status: AC
  Filled 2020-05-25: qty 1

## 2020-05-25 MED ORDER — MIDAZOLAM HCL 5 MG/5ML IJ SOLN
INTRAMUSCULAR | Status: DC | PRN
  Administered 2020-05-25: 2 mg via INTRAVENOUS

## 2020-05-25 MED ORDER — LIDOCAINE 2% (20 MG/ML) 5 ML SYRINGE
INTRAMUSCULAR | Status: DC | PRN
  Administered 2020-05-25: 60 mg via INTRAVENOUS

## 2020-05-25 MED ORDER — DEXMEDETOMIDINE (PRECEDEX) IN NS 20 MCG/5ML (4 MCG/ML) IV SYRINGE
PREFILLED_SYRINGE | INTRAVENOUS | Status: AC
  Filled 2020-05-25: qty 5

## 2020-05-25 MED ORDER — EPHEDRINE 5 MG/ML INJ
INTRAVENOUS | Status: AC
  Filled 2020-05-25: qty 10

## 2020-05-25 MED ORDER — LIDOCAINE 2% (20 MG/ML) 5 ML SYRINGE
INTRAMUSCULAR | Status: AC
  Filled 2020-05-25: qty 5

## 2020-05-25 SURGICAL SUPPLY — 20 items
BLADE SURG 15 STRL LF DISP TIS (BLADE) ×1 IMPLANT
BLADE SURG 15 STRL SS (BLADE) ×1
CANISTER SUCT 1200ML W/VALVE (MISCELLANEOUS) ×2 IMPLANT
COVER MAYO STAND STRL (DRAPES) ×2 IMPLANT
ELECT REM PT RETURN 9FT ADLT (ELECTROSURGICAL) ×2
ELECTRODE REM PT RTRN 9FT ADLT (ELECTROSURGICAL) ×1 IMPLANT
GAUZE 4X4 16PLY RFD (DISPOSABLE) IMPLANT
GLOVE BIOGEL M 7.0 STRL (GLOVE) ×2 IMPLANT
GOWN STRL REUS W/ TWL LRG LVL3 (GOWN DISPOSABLE) ×2 IMPLANT
GOWN STRL REUS W/TWL LRG LVL3 (GOWN DISPOSABLE) ×2
NS IRRIG 1000ML POUR BTL (IV SOLUTION) ×2 IMPLANT
PACK BASIN DAY SURGERY FS (CUSTOM PROCEDURE TRAY) ×2 IMPLANT
PENCIL SMOKE EVACUATOR (MISCELLANEOUS) IMPLANT
SHEET MEDIUM DRAPE 40X70 STRL (DRAPES) ×2 IMPLANT
SUT CHROMIC 4 0 RB 1X27 (SUTURE) ×2 IMPLANT
SYR BULB EAR ULCER 3OZ GRN STR (SYRINGE) ×2 IMPLANT
TOWEL GREEN STERILE FF (TOWEL DISPOSABLE) ×2 IMPLANT
TRAY DSU PREP LF (CUSTOM PROCEDURE TRAY) IMPLANT
TUBE CONNECTING 20X1/4 (TUBING) ×2 IMPLANT
YANKAUER SUCT BULB TIP NO VENT (SUCTIONS) ×2 IMPLANT

## 2020-05-25 NOTE — Anesthesia Postprocedure Evaluation (Signed)
Anesthesia Post Note  Patient: Vincent Bray  Procedure(s) Performed: MANDIBULAR HARDWARE REMOVAL (N/A Mouth)     Patient location during evaluation: PACU Anesthesia Type: MAC Level of consciousness: awake and alert Pain management: pain level controlled Vital Signs Assessment: post-procedure vital signs reviewed and stable Respiratory status: spontaneous breathing, nonlabored ventilation and respiratory function stable Cardiovascular status: stable and blood pressure returned to baseline Anesthetic complications: no   No complications documented.  Last Vitals:  Vitals:   05/25/20 1200 05/25/20 1215  BP: 100/62 (!) 115/91  Pulse: (!) 45 (!) 45  Resp: 12 12  Temp:  36.7 C  SpO2: 97% 100%    Last Pain:  Vitals:   05/25/20 1215  TempSrc:   PainSc: [place]

## 2020-05-25 NOTE — Discharge Instructions (Signed)
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
## 2020-05-25 NOTE — Anesthesia Preprocedure Evaluation (Addendum)
Anesthesia Evaluation  Patient identified by MRN, date of birth, ID band Patient awake    Reviewed: Allergy & Precautions, NPO status , Patient's Chart, lab work & pertinent test results  History of Anesthesia Complications Negative for: history of anesthetic complications  Airway   TM Distance: >3 FB Neck ROM: Full   Comment:  Unable to assess mallampati as jaw is wired shut  Dental  (+) Dental Advisory Given   Pulmonary former smoker,    Pulmonary exam normal        Cardiovascular + DVT  Normal cardiovascular exam     Neuro/Psych negative neurological ROS  negative psych ROS   GI/Hepatic negative GI ROS, (+)     substance abuse  marijuana use,   Endo/Other  negative endocrine ROS  Renal/GU negative Renal ROS     Musculoskeletal negative musculoskeletal ROS (+)   Abdominal   Peds  Hematology negative hematology ROS (+)   Anesthesia Other Findings Covid test negative  Reproductive/Obstetrics                           Anesthesia Physical Anesthesia Plan  ASA: II  Anesthesia Plan: MAC   Post-op Pain Management:    Induction: Intravenous  PONV Risk Score and Plan: 1 and Treatment may vary due to age or medical condition and Propofol infusion  Airway Management Planned: Nasal Cannula and Natural Airway  Additional Equipment: None  Intra-op Plan:   Post-operative Plan:   Informed Consent: I have reviewed the patients History and Physical, chart, labs and discussed the procedure including the risks, benefits and alternatives for the proposed anesthesia with the patient or authorized representative who has indicated his/her understanding and acceptance.       Plan Discussed with: CRNA and Anesthesiologist  Anesthesia Plan Comments:        Anesthesia Quick Evaluation

## 2020-05-25 NOTE — Op Note (Signed)
Operative Note: MANDIBULO-MAXILLARY FIXATION  Patient: Vincent Bray  Medical record number: [id number]  Date:05/25/2020  Pre-operative Indications: Mandible fracture  Postoperative Indications: Same  Surgical Procedure: Mandibulo-Maxillary Fixation Removal  Anesthesia: GET  Surgeon: Delsa Bern, M.D.  Complications: None  EBL: Min   Brief History: The patient is a 34 y.o. male with a history of acute mandible fracture.  Pt underwent mandibulomaxillary fixation for management of mandible fracture on 03/04/2020 using Biomet arch bars and mandibulomaxillary fixation wiring.   After adequate time for appropriate healing the patient presents for removal of mandibulomaxillary fixation hardware.  Given the patient's history and findings, I recommended mandibulo-maxillary fixation hardware removal under sedated anesthesia, risks and benefits were discussed in detail with the patient and family. They understand and agree with our plan for surgery which is scheduled at [facility] day [facility].  Surgical Procedure: The patient is brought to the operating room on 05/25/2020 and placed in supine position on the operating table.  Sedated/MAC anesthesia was established without difficulty. When the patient was adequately anesthetized, surgical timeout was performed and correct identification of the patient and the surgical procedure. The patient was positioned and prepped and draped in sterile fashion.    Mandibular maxillary fixation wires were then cut and removed.  Soft tissue overlying the fixation screws was elevated.  Screws were removed along with from the mandibular and maxillary arch bars.  No significant bleeding.  Good occlusion.  The patient's incisions were closed with interrupted 4-0 chromic suture.  Patient was awakened from anesthetic, extubated and transferred from the operating room to the recovery room in stable condition. There were no complications and blood loss was  minimal.   Delsa Bern, M.D. [facility] 05/25/2020

## 2020-05-25 NOTE — Transfer of Care (Signed)
Immediate Anesthesia Transfer of Care Note  Patient: Vincent Bray  Procedure(s) Performed: MANDIBULAR HARDWARE REMOVAL (N/A Mouth)  Patient Location: PACU  Anesthesia Type:MAC  Level of Consciousness: drowsy  Airway & Oxygen Therapy: Patient Spontanous Breathing and Patient connected to face mask oxygen  Post-op Assessment: Report given to RN and Post -op Vital signs reviewed and stable  Post vital signs: Reviewed and stable  Last Vitals:  Vitals Value Taken Time  BP    Temp    Pulse 52 05/25/20 1127  Resp 7 05/25/20 1127  SpO2 100 % 05/25/20 1127  Vitals shown include unvalidated device data.  Last Pain:  Vitals:   05/25/20 0913  TempSrc: Oral  PainSc: 0-No pain         Complications: No complications documented.

## 2020-05-26 ENCOUNTER — Encounter (HOSPITAL_BASED_OUTPATIENT_CLINIC_OR_DEPARTMENT_OTHER): Payer: Self-pay | Admitting: Otolaryngology

## 2021-12-13 ENCOUNTER — Encounter (HOSPITAL_COMMUNITY): Payer: Self-pay | Admitting: Emergency Medicine

## 2021-12-13 ENCOUNTER — Emergency Department (HOSPITAL_COMMUNITY)
Admission: EM | Admit: 2021-12-13 | Discharge: 2021-12-13 | Disposition: A | Discharge status: Home / Self Care | Attending: Emergency Medicine | Admitting: Emergency Medicine

## 2021-12-13 DIAGNOSIS — I82401 Acute embolism and thrombosis of unspecified deep veins of right lower extremity: Secondary | ICD-10-CM | POA: Diagnosis not present

## 2021-12-13 DIAGNOSIS — I824Z1 Acute embolism and thrombosis of unspecified deep veins of right distal lower extremity: Secondary | ICD-10-CM

## 2021-12-13 DIAGNOSIS — R2241 Localized swelling, mass and lump, right lower limb: Secondary | ICD-10-CM | POA: Diagnosis present

## 2021-12-13 LAB — CBC
HCT: 42.8 % (ref 39.0–52.0)
Hemoglobin: 14.4 g/dL (ref 13.0–17.0)
MCH: 31.4 pg (ref 26.0–34.0)
MCHC: 33.6 g/dL (ref 30.0–36.0)
MCV: 93.4 fL (ref 80.0–100.0)
Platelets: 161 10*3/uL (ref 150–400)
RBC: 4.58 MIL/uL (ref 4.22–5.81)
RDW: 12.7 % (ref 11.5–15.5)
WBC: 6.1 10*3/uL (ref 4.0–10.5)
nRBC: 0 % (ref 0.0–0.2)

## 2021-12-13 LAB — BASIC METABOLIC PANEL
Anion gap: 7 (ref 5–15)
BUN: 8 mg/dL (ref 6–20)
CO2: 25 mmol/L (ref 22–32)
Calcium: 9.8 mg/dL (ref 8.9–10.3)
Chloride: 105 mmol/L (ref 98–111)
Creatinine, Ser: 1.28 mg/dL — ABNORMAL HIGH (ref 0.61–1.24)
GFR, Estimated: >60 mL/min (ref >60)
Glucose, Bld: 91 mg/dL (ref 70–99)
Potassium: 5.2 mmol/L — ABNORMAL HIGH (ref 3.5–5.1)
Sodium: 137 mmol/L (ref 135–145)

## 2021-12-13 MED ORDER — APIXABAN 5 MG PO TABS
10.0000 mg | ORAL_TABLET | Freq: Once | ORAL | Status: AC
  Administered 2021-12-13: 10 mg via ORAL
  Filled 2021-12-13: qty 2

## 2021-12-13 MED ORDER — APIXABAN (ELIQUIS) VTE STARTER PACK (10MG AND 5MG): ORAL_TABLET | ORAL | 0 refills | Status: DC

## 2021-12-13 MED ORDER — APIXABAN (ELIQUIS) VTE STARTER PACK (10MG AND 5MG): ORAL_TABLET | ORAL | 0 refills | Status: AC

## 2021-12-13 MED ORDER — ACETAMINOPHEN 500 MG PO TABS
1000.0000 mg | ORAL_TABLET | Freq: Once | ORAL | Status: AC
  Administered 2021-12-13: 1000 mg via ORAL
  Filled 2021-12-13: qty 2

## 2021-12-13 NOTE — ED Provider Notes (Signed)
[facility] EMERGENCY DEPARTMENT Provider Note   CSN: [id number] Arrival date & time: 12/13/21  1829     History  Chief Complaint  Patient presents with   DVT    Vincent Bray is a 36 y.o. male.  Pt with remote hx dvt, not currently on anticoag therapy, c/o right lower leg swelling in past few days. Symptoms acute onset, mild/moderate, persistent. Denies severe leg pain. No numbness/weakness. Denies any chest pain or discomfort. No sob or unusual doe. No recent abnormal bruising or bleeding. States tolerates prior anticoag therapy well without complication. No fever or chills. [facility] today and told positive for dvt and sent to ed for tx.   The history is provided by the patient and medical records (L.E.O.).      Home Medications Prior to Admission medications   Not on File      Allergies    Penicillins    Review of Systems   Review of Systems  Constitutional:  Negative for fever.  HENT:  Negative for nosebleeds.   Eyes:  Negative for redness.  Respiratory:  Negative for shortness of breath.   Cardiovascular:  Positive for leg swelling. Negative for chest pain.  Gastrointestinal:  Negative for blood in stool.  Skin:  Negative for rash.  Neurological:  Negative for headaches.  Hematological:  Does not bruise/bleed easily.   Physical Exam Updated Vital Signs BP (!) 129/95 (BP Location: Left Arm)   Pulse 74   Temp 98.5 F (36.9 C) (Oral)   Resp 16   SpO2 100%  Physical Exam Vitals and nursing note reviewed.  Constitutional:      Appearance: Normal appearance. He is well-developed.  HENT:     Head: Atraumatic.     Nose: Nose normal.     Mouth/Throat:     Mouth: Mucous membranes are moist.  Eyes:     General: No scleral icterus.    Conjunctiva/sclera: Conjunctivae normal.  Neck:     Trachea: No tracheal deviation.  Cardiovascular:     Rate and Rhythm: Normal rate and regular rhythm.     Pulses: Normal pulses.     Heart  sounds: Normal heart sounds. No murmur heard.   No friction rub. No gallop.  Pulmonary:     Effort: Pulmonary effort is normal. No accessory muscle usage or respiratory distress.     Breath sounds: Normal breath sounds.  Genitourinary:    Comments: No cva tenderness. Musculoskeletal:     Cervical back: Neck supple.     Comments: Mild right lower leg swelling. Distal pulse palp. No skin changes/lesions/cellulitis.   Skin:    General: Skin is warm and dry.     Findings: No rash.  Neurological:     Mental Status: He is alert.     Comments: Alert, speech clear. RLE nvi.   Psychiatric:        Mood and Affect: Mood normal.    ED Results / Procedures / Treatments   Labs (all labs ordered are listed, but only abnormal results are displayed) Results for orders placed or performed during the hospital encounter of 12/13/21  CBC  Result Value Ref Range   WBC 6.1 4.0 - 10.5 K/uL   RBC 4.58 4.22 - 5.81 MIL/uL   Hemoglobin 14.4 13.0 - 17.0 g/dL   HCT 42.8 39.0 - 52.0 %   MCV 93.4 80.0 - 100.0 fL   MCH 31.4 26.0 - 34.0 pg   MCHC 33.6 30.0 -  36.0 g/dL   RDW 12.7 11.5 - 15.5 %   Platelets 161 150 - 400 K/uL   nRBC 0.0 0.0 - 0.2 %  Basic metabolic panel  Result Value Ref Range   Sodium 137 135 - 145 mmol/L   Potassium 5.2 (H) 3.5 - 5.1 mmol/L   Chloride 105 98 - 111 mmol/L   CO2 25 22 - 32 mmol/L   Glucose, Bld 91 70 - 99 mg/dL   BUN 8 6 - 20 mg/dL   Creatinine, Ser 1.28 (H) 0.61 - 1.24 mg/dL   Calcium 9.8 8.9 - 10.3 mg/dL   GFR, Estimated >60 >60 mL/min   Anion gap 7 5 - 15    EKG None  Radiology No results found.  Procedures Procedures    Medications Ordered in ED Medications  apixaban (ELIQUIS) tablet 10 mg (has no administration in time range)    ED Course/ Medical Decision Making/ A&P                           Medical Decision Making Problems Addressed: Lower leg DVT (deep venous thromboembolism), acute, right ([place]): acute illness or injury with systemic  symptoms that poses a threat to life or bodily functions  Amount and/or Complexity of Data Reviewed Independent Historian:     Details: LEO, hx External Data Reviewed: notes. Labs: ordered. Decision-making details documented in ED Course.  Risk OTC drugs. Prescription drug management.   Labs sent.    Reviewed nursing notes and prior charts for additional history.  Records from jail reviewed. Pt notes u/s/dvt study was positive.   Eliquis po.   Labs reviewed/interpreted by me - wbc, hgb and plt normal.   Pt appears stable for d/c.   Rx provided.           Final Clinical Impression(s) / ED Diagnoses Final diagnoses:  None    Rx / DC Orders ED Discharge Orders     None         Lajean Saver, MD 12/13/21 2200

## 2021-12-13 NOTE — Discharge Instructions (Addendum)
It was our pleasure to provide your ER care today - we hope that you feel better.  Take eliquis (blood thinner). Elevate leg.  Drink plenty of fluids/stay well hydrated.   Follow up with primary care doctor in the next couple weeks, including for refill/continuation of blood thinner treatment.  Return to ER if worse, new symptoms, chest pain, trouble breathing, severe leg pain, abnormal bleeding, or other concern.

## 2021-12-13 NOTE — ED Provider Triage Note (Signed)
Emergency Medicine Provider Triage Evaluation Note  Vincent Bray , a 36 y.o. male  was evaluated in triage.  Pt complains of DVT.  Had ultrasound done today in jail which showed a DVT in his right lower extremity.  History of DVT about 8 years ago without a known cause that he is aware of.  No longer on anticoagulation.  Denies any chest pain, shortness of breath.  Review of Systems  Positive: Right leg pain Negative: Chest pain, shortness of breath  Physical Exam  BP (!) 129/95 (BP Location: Left Arm)   Pulse 74   Temp 98.5 F (36.9 C) (Oral)   Resp 16   SpO2 100%  Gen:   Awake, no distress   Resp:  Normal effort  MSK:   Moves extremities without difficulty  Other:  Ambulating without difficulty  Medical Decision Making  Medically screening exam initiated at 6:34 PM.  Appropriate orders placed.  Vincent Bray was informed that the remainder of the evaluation will be completed by another provider, this initial triage assessment does not replace that evaluation, and the importance of remaining in the ED until their evaluation is complete.  Will check baseline labs prior to starting anticoagulation.   Dietrich Pates, PA-C 12/13/21 1835

## 2021-12-13 NOTE — ED Triage Notes (Signed)
Patient sent to [facility] from jail for evaluation of DVT in right leg, patient complains of pain since Monday this week and states an ultrasound was performed at the jail which showed a DVT.

## 2022-09-27 IMAGING — DX DG ORTHOPANTOGRAM /PANORAMIC
1 series · 1 of 1 positions shown · non-contrast
Comparison: CT March 05, 2020

CLINICAL DATA: Closed fracture of the mandible with routine
healing.

EXAM:
ORTHOPANTOGRAM/PANORAMIC

[view not recorded]
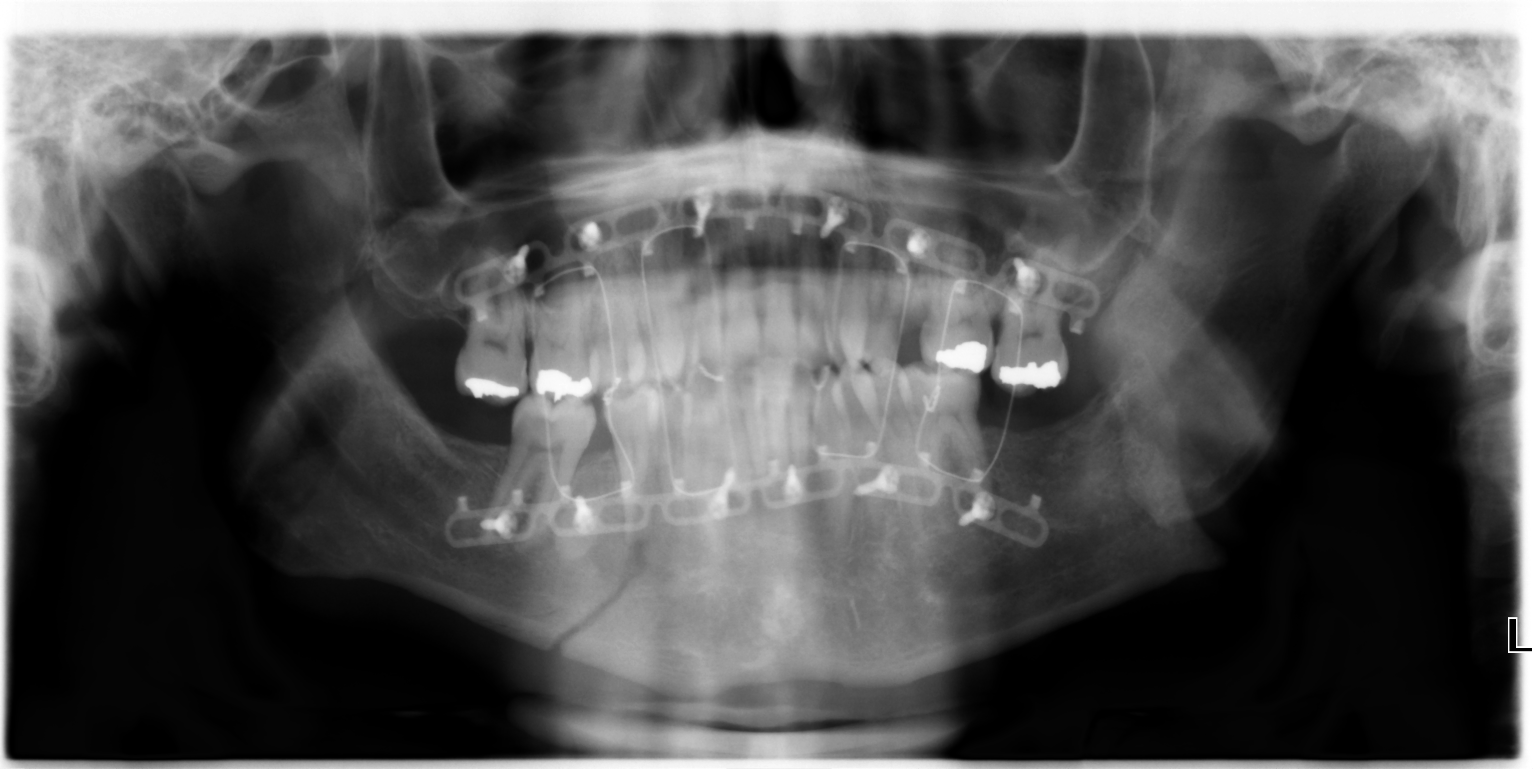

[1 of 1 positions shown; findings below may reference images not displayed]

FINDINGS: Image limited by patient related factors, dense hair attenuating the
beam and causing some artifact.

Better approximation of RIGHT mandibular fracture but still with
lucent line passing through the RIGHT mandible.

Continued overlap and displacement of LEFT mandible. Subtle callus
formation is suggested along the anterior fracture fragment. The
anterior mandible is approximately 8 mm inferiorly displaced
relative to the posterior mandible.

The patient's jaw is wired shut.
IMPRESSION: Signs of postoperative change about the mandible with wiring of the
patient's jaw. Reapproximation of RIGHT mandibular fracture still
with lucent line at the fracture site and with lucency showing
limited bony callus formation in this location.

Persistent overlap and displacement of LEFT mandible with question
of some callus formation as described.

Exam limited as discussed above.
